# Patient Record
Sex: Male | Born: 1990 | Race: Black or African American | Hispanic: No | Marital: Single | Smoking: Never smoker
Health system: Southern US, Community
[De-identification: ages and names within clinical notes are randomized; demographics above are authoritative.]

## PROBLEM LIST (undated history)

## (undated) DIAGNOSIS — K649 Unspecified hemorrhoids: Secondary | ICD-10-CM

---
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
## 2011-01-06 LAB — TSH: TSH: 0.573 u[IU]/mL (ref 0.350–4.500)

## 2011-01-14 ENCOUNTER — Emergency Department: Payer: Self-pay | Admitting: *Deleted

## 2012-05-06 IMAGING — CR DG SHOULDER 3+V*L*
1 series · 2 of 2 positions shown · non-contrast
Comparison: none

REASON FOR EXAM: pain
COMMENTS:

PROCEDURE:     DXR - DXR SHOULDER LEFT COMPLETE  - January 14, 2011 [DATE]
RESULT:     Images of the left shoulder demonstrate an anterior location
with inferior displacement of the humeral head. No fracture is seen.

[Series 1: view not recorded · 0.17mm/px · 2 of 2 slices shown]
[im 1/2]
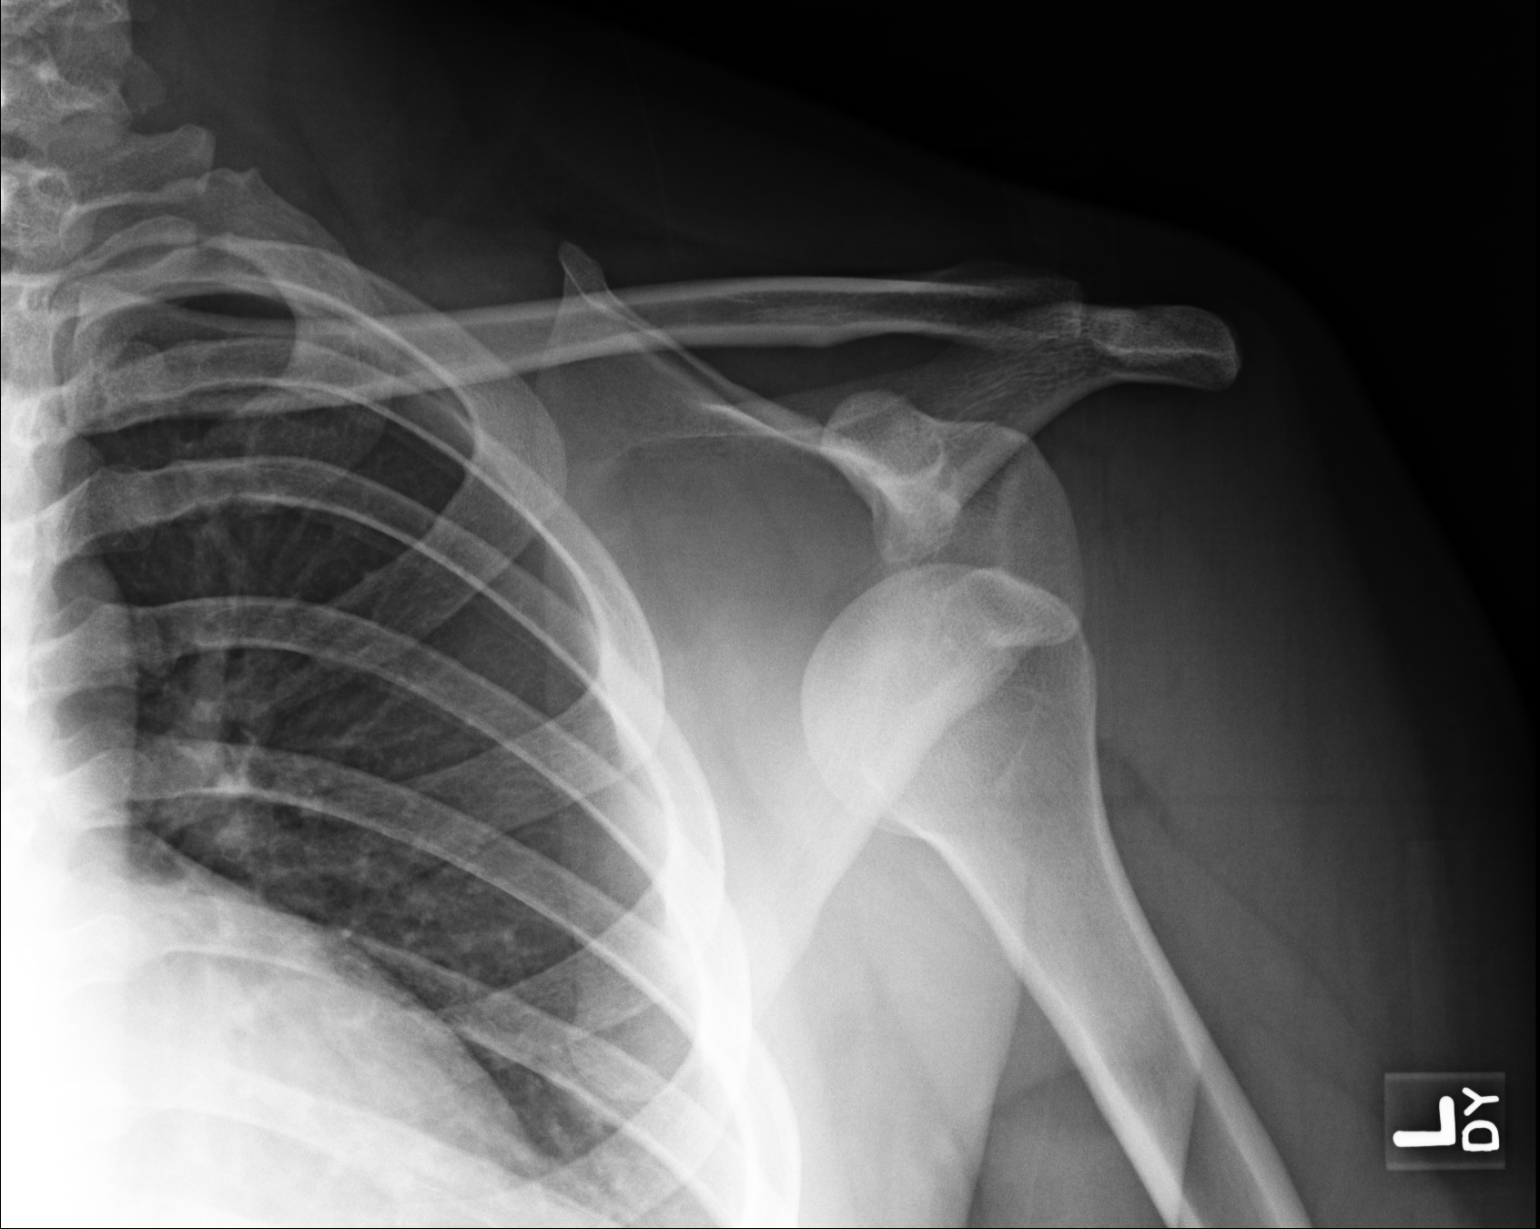
[im 2/2]
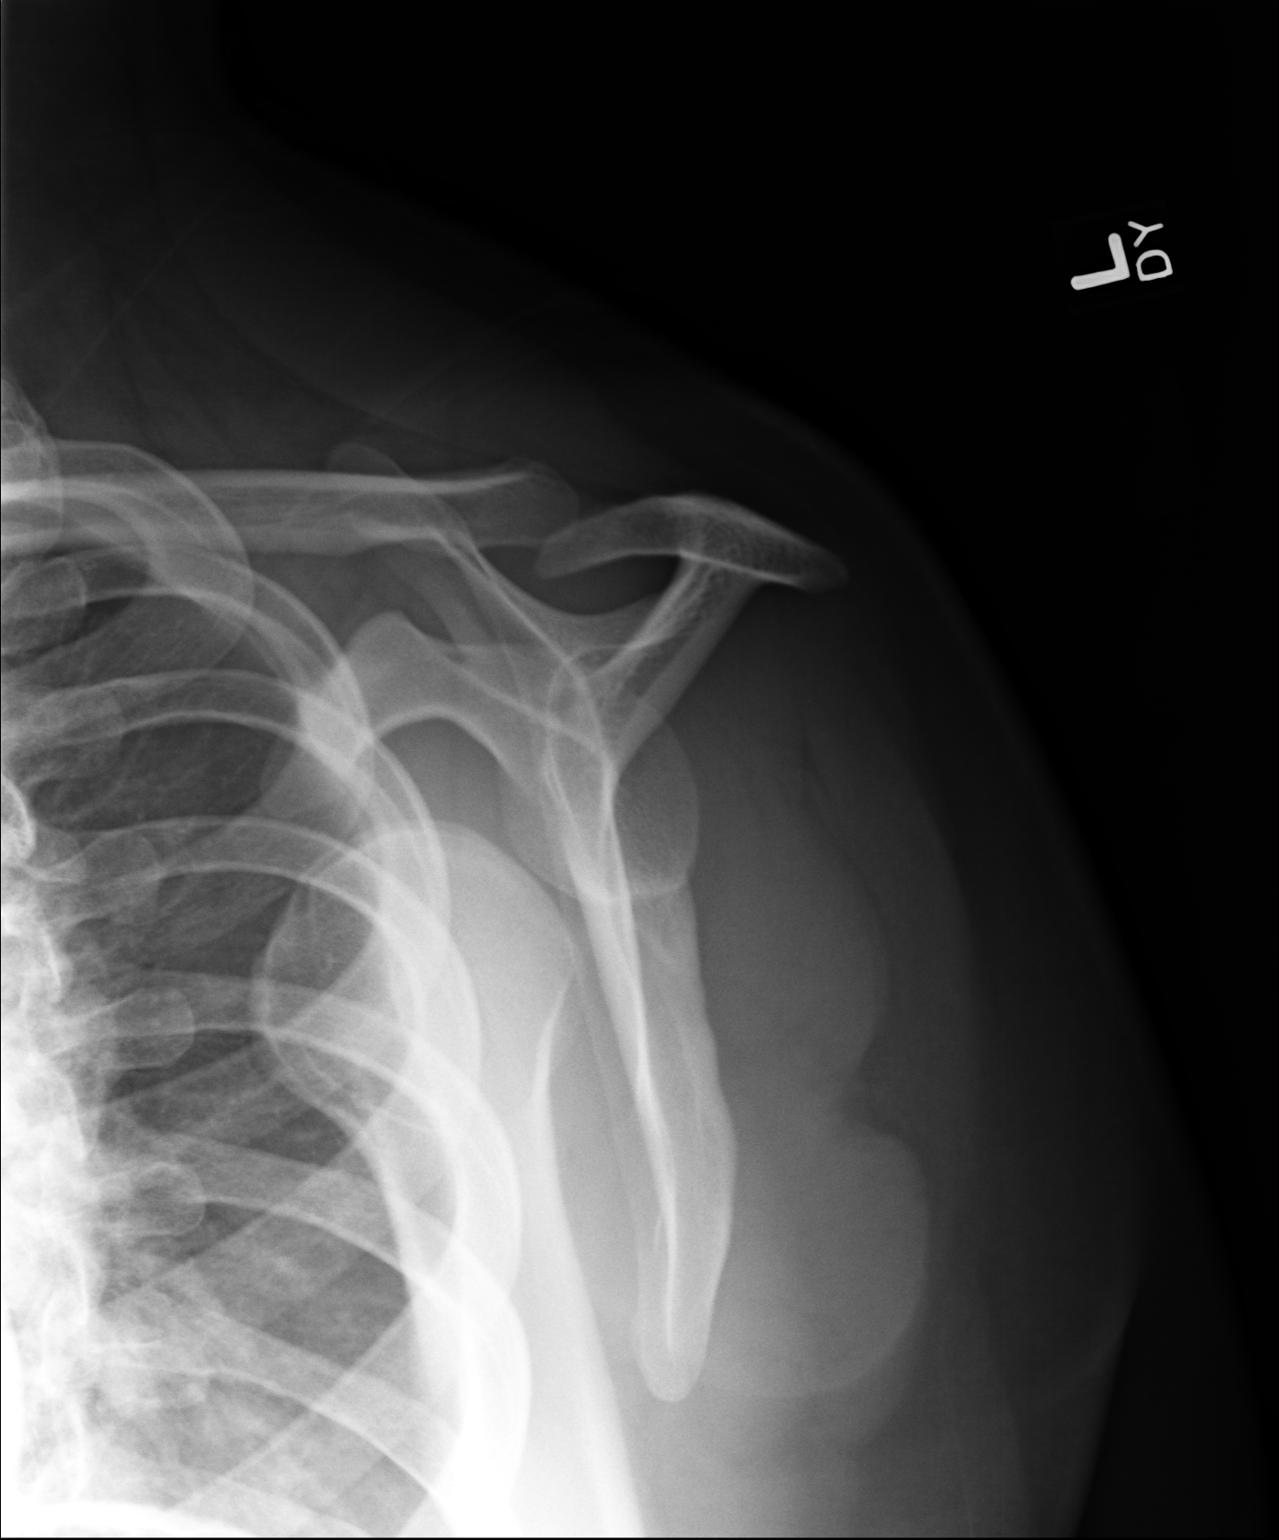

[2 of 2 positions shown; findings below may reference images not displayed]

IMPRESSION: Left humeral dislocation anteriorly.

## 2012-05-06 IMAGING — CR DG SHOULDER 1V*L*
1 series · 1 of 1 positions shown · non-contrast
Comparison: none

REASON FOR EXAM: post reduction
COMMENTS:   Bedside (portable):Y

PROCEDURE:     DXR - DXR SHOULDER LEFT ONE VIEW  - January 14, 2011  [DATE]
RESULT:     Post reduction left shoulder image demonstrates the humeral head
appears located in the glenoid. No fractures appreciated on this single view.

[view not recorded]
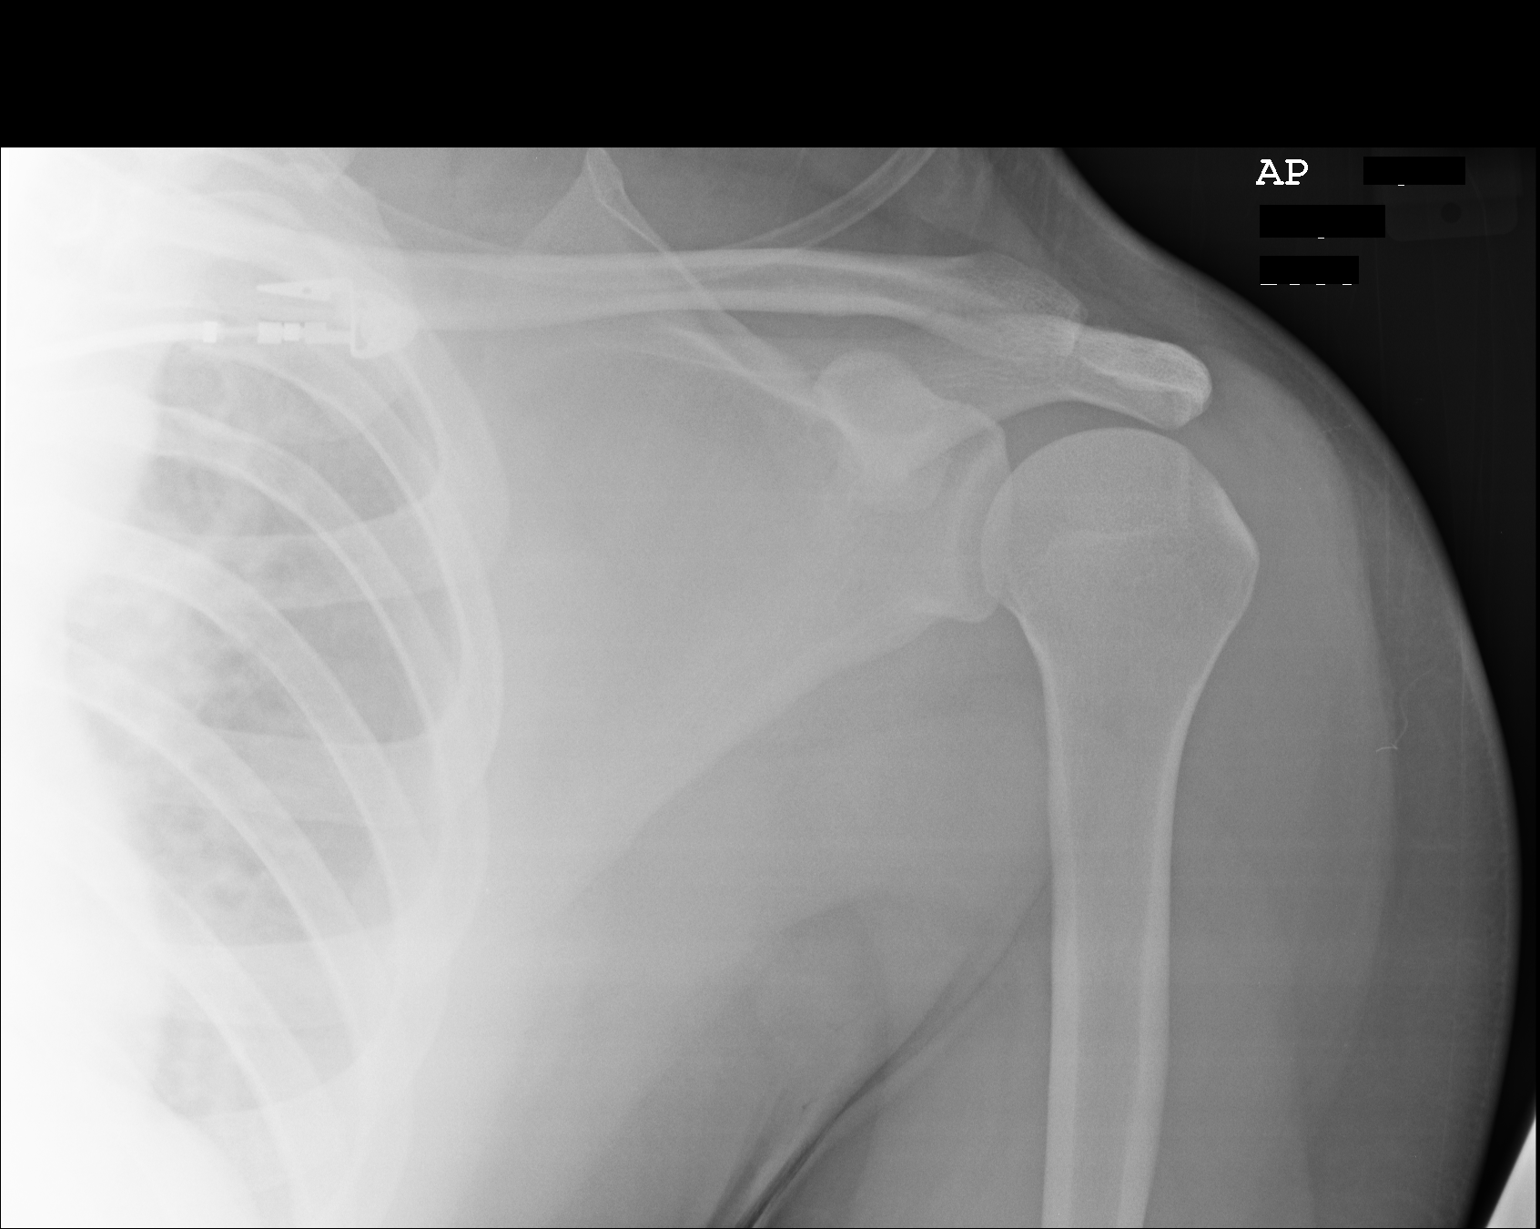

[1 of 1 positions shown; findings below may reference images not displayed]

IMPRESSION: Please see above.

## 2013-11-14 ENCOUNTER — Ambulatory Visit: Payer: Self-pay | Admitting: Family Medicine

## 2017-04-23 ENCOUNTER — Ambulatory Visit (INDEPENDENT_AMBULATORY_CARE_PROVIDER_SITE_OTHER): Payer: BLUE CROSS/BLUE SHIELD

## 2017-04-23 ENCOUNTER — Other Ambulatory Visit: Payer: Self-pay

## 2017-04-23 ENCOUNTER — Encounter: Payer: Self-pay | Admitting: Emergency Medicine

## 2017-04-23 ENCOUNTER — Ambulatory Visit
Admission: EM | Admit: 2017-04-23 | Discharge: 2017-04-23 | Disposition: A | Discharge status: Home / Self Care | Payer: BLUE CROSS/BLUE SHIELD | Attending: Emergency Medicine | Admitting: Emergency Medicine

## 2017-04-23 DIAGNOSIS — J181 Lobar pneumonia, unspecified organism: Secondary | ICD-10-CM

## 2017-04-23 DIAGNOSIS — J029 Acute pharyngitis, unspecified: Secondary | ICD-10-CM

## 2017-04-23 DIAGNOSIS — J189 Pneumonia, unspecified organism: Secondary | ICD-10-CM

## 2017-04-23 DIAGNOSIS — R05 Cough: Secondary | ICD-10-CM

## 2017-04-23 DIAGNOSIS — M791 Myalgia, unspecified site: Secondary | ICD-10-CM | POA: Diagnosis not present

## 2017-04-23 LAB — RAPID INFLUENZA A&B ANTIGENS
Influenza A (ARMC): NEGATIVE
Influenza B (ARMC): NEGATIVE

## 2017-04-23 LAB — RAPID STREP SCREEN (MED CTR MEBANE ONLY): Streptococcus, Group A Screen (Direct): NEGATIVE

## 2017-04-23 MED ORDER — ALBUTEROL SULFATE HFA 108 (90 BASE) MCG/ACT IN AERS: 2.0000 | INHALATION_SPRAY | RESPIRATORY_TRACT | 0 refills | Status: DC | PRN

## 2017-04-23 MED ORDER — AZITHROMYCIN 250 MG PO TABS: 250.0000 mg | ORAL_TABLET | Freq: Every day | ORAL | 0 refills | Status: DC

## 2017-04-23 MED ORDER — HYDROCOD POLST-CPM POLST ER 10-8 MG/5ML PO SUER: 5.0000 mL | Freq: Two times a day (BID) | ORAL | 0 refills | Status: DC | PRN

## 2017-04-23 MED ORDER — AEROCHAMBER PLUS MISC: 2 refills | Status: DC

## 2017-04-23 MED ORDER — BENZONATATE 200 MG PO CAPS: 200.0000 mg | ORAL_CAPSULE | Freq: Three times a day (TID) | ORAL | 0 refills | Status: DC | PRN

## 2017-04-23 MED ORDER — IBUPROFEN 800 MG PO TABS
800.0000 mg | ORAL_TABLET | Freq: Once | ORAL | Status: AC
  Administered 2017-04-23: 800 mg via ORAL

## 2017-04-23 MED ORDER — FLUTICASONE PROPIONATE 50 MCG/ACT NA SUSP: 2.0000 | Freq: Every day | NASAL | 0 refills | Status: DC

## 2017-04-23 MED ORDER — IBUPROFEN 600 MG PO TABS: 600.0000 mg | ORAL_TABLET | Freq: Four times a day (QID) | ORAL | 0 refills | Status: DC | PRN

## 2017-04-23 NOTE — ED Triage Notes (Signed)
Patient c/o sore throat, cough and congestion since Tuesday.  Patient reports fever for the past 2 days.

## 2017-04-23 NOTE — ED Provider Notes (Signed)
HPI  SUBJECTIVE:  [name] is a 27 y.o. male who presents with body aches, headaches, greenish nasal congestion, postnasal drip, sore throat and fevers T-max 103.4 for 5 days.  He reports a cough productive of the same material as his nasal congestion, wheezing at night.  Reports shortness of breath with exertion.  No ear pain, sinus pain or pressure, dental pain, no muffled, hot potato voice, no drooling, trismus, neck stiffness.  No chest pain, shortness of breath.  States that he cannot sleep at night secondary to the cough.  No abdominal pain, urinary complaints, diarrhea, rash.  No photophobia.  No antibiotics in the past month, antipyretic in the past 6-8 hours.  He has been doing steam baths, ibuprofen 400 mg, Alka-Seltzer flu.  Steam helps.  No aggravating factors.  He did not get a flu shot this year.  No contacts with flu or strep.  He has a past medical history of chronic nosebleeds.  No history of diabetes, hypertension, mono, asthma, emphysema, COPD, immunocompromise.  PMD: None.    History reviewed. No pertinent past medical history.  History reviewed. No pertinent surgical history.  History reviewed. No pertinent family history.  Social History   Tobacco Use  . Smoking status: Never Smoker  . Smokeless tobacco: Never Used  Substance Use Topics  . Alcohol use: Yes  . Drug use: Not on file    No current facility-administered medications for this encounter.   Current Outpatient Medications:  .  albuterol (PROVENTIL HFA;VENTOLIN HFA) 108 (90 Base) MCG/ACT inhaler, Inhale 2 puffs into the lungs every 4 (four) hours as needed for wheezing or shortness of breath. Dispense with aerochamber, Disp: 1 Inhaler, Rfl: 0 .  azithromycin (ZITHROMAX) 250 MG tablet, Take 1 tablet (250 mg total) by mouth daily. 2 tabs po on day 1, 1 tab po on days 2-5, Disp: 6 tablet, Rfl: 0 .  benzonatate (TESSALON) 200 MG capsule, Take 1 capsule (200 mg total) by mouth 3 (three) times daily as  needed for cough., Disp: 30 capsule, Rfl: 0 .  chlorpheniramine-HYDROcodone (TUSSIONEX PENNKINETIC ER) 10-8 MG/5ML SUER, Take 5 mLs by mouth every 12 (twelve) hours as needed for cough., Disp: 120 mL, Rfl: 0 .  fluticasone (FLONASE) 50 MCG/ACT nasal spray, Place 2 sprays into both nostrils daily., Disp: 16 g, Rfl: 0 .  ibuprofen (ADVIL,MOTRIN) 600 MG tablet, Take 1 tablet (600 mg total) by mouth every 6 (six) hours as needed., Disp: 30 tablet, Rfl: 0 .  Spacer/Aero-Holding Chambers (AEROCHAMBER PLUS) inhaler, Use as instructed, Disp: 1 each, Rfl: 2  No Known Allergies   ROS  As noted in HPI.   Physical Exam  BP (!) 137/99 (BP Location: Left Arm)   Pulse (!) 120   Temp (!) 101.3 F (38.5 C) (Oral)   Resp 16   Ht 5\' 10"  (1.778 m)   Wt (!) 355 lb (161 kg)   SpO2 97%   BMI 50.94 kg/m   Constitutional: Well developed, well nourished, no acute distress Eyes: PERRL, EOMI, conjunctiva normal bilaterally HENT: Normocephalic, atraumatic,mucus membranes moist mild nasal congestion.  Erythematous, swollen turbinates.  No sinus tenderness.  Erythematous oropharynx.  Tonsils surgically absent.  Positive cobblestoning, positive postnasal drip. Neck: No appreciable cervical lymphadenopathy, meningismus. Respiratory: Clear to auscultation bilaterally, no rales, no wheezing, no rhonchi Cardiovascular: Regular tachycardia, no murmurs, no gallops, no rubs GI: Soft, nondistended, normal bowel sounds, nontender, no rebound, no guarding Back: no CVAT skin: No rash, skin intact Musculoskeletal: No edema,  no tenderness, no deformities Neurologic: Alert & oriented x 3, CN II-XII grossly intact, no motor deficits, sensation grossly intact Psychiatric: Speech and behavior appropriate   ED Course   Medications  ibuprofen (ADVIL,MOTRIN) tablet 800 mg (800 mg Oral Given 04/23/17 0830)    Orders Placed This Encounter  Procedures  . Rapid Influenza A&B Antigens ([facility] only)    Standing Status:    Standing    Number of Occurrences:   1  . Rapid strep screen    Standing Status:   Standing    Number of Occurrences:   1  . Culture, group A strep    Standing Status:   Standing    Number of Occurrences:   1  . DG Chest 2 View    Standing Status:   Standing    Number of Occurrences:   1    Order Specific Question:   Reason for Exam (SYMPTOM  OR DIAGNOSIS REQUIRED)    Answer:   cough fever r/o PNA  . Droplet precaution    Standing Status:   Standing    Number of Occurrences:   1   Results for orders placed or performed during the hospital encounter of 04/23/17 (from the past 24 hour(s))  Rapid Influenza A&B Antigens ([facility] only)     Status: None   Collection Time: 04/23/17  8:21 AM  Result Value Ref Range   Influenza A ([facility]) NEGATIVE NEGATIVE   Influenza B ([facility]) NEGATIVE NEGATIVE  Rapid strep screen     Status: None   Collection Time: 04/23/17  8:21 AM  Result Value Ref Range   Streptococcus, Group A Screen (Direct) NEGATIVE NEGATIVE   Dg Chest 2 View  Result Date: 04/23/2017 CLINICAL DATA:  Cough, fever EXAM: CHEST  2 VIEW COMPARISON:  06/17/2008 FINDINGS: Patchy right lower lobe airspace process obscures the right hemidiaphragm and the medial cardiac border. This is posterior on the lateral view and compatible with right lower lobe pneumonia. Left lung remains clear. Mild cardiac enlargement but normal vascularity. No edema, effusion, or pneumothorax. Trachea is midline. IMPRESSION: Patchy right lower lobe pneumonia. Recommend radiographic follow-up after medical therapy to document resolution. Electronically Signed   By: Judie Petit.  Shick M.D.   On: 04/23/2017 09:05    ED Clinical Impression  Community acquired pneumonia of right lower lobe of lung [facility])   ED Assessment/Plan  Checking strep, flu, chest x-ray because of fever and cough.  Will reevaluate.    [place] Narcotic database reviewed for this patient, and feel that the risk/benefit ratio today is favorable for proceeding with  a prescription for controlled substance. no opiate rx in 2 years  Reviewed imaging independently.  Patchy right lower lobe pneumonia. See radiology report for full details.  Will send home with an albuterol inhaler with a spacer, azithromycin Z-Pak as he is otherwise healthy and has no comorbidities,  Flonase, ibuprofen 600 mg take with 1 g of Tylenol.  Advised saline nasal irrigation.  Also Tussionex, Tessalon.  Will write a work note until Thursday.  Will provide a primary care referral list and order primary care referral so that patient can be followed up and have a repeat chest x-ray in 3-4 weeks, or may return here.  He is to go to the ER if he gets worse.  Discussed  imaging, MDM, plan and followup with patient Discussed sn/sx that should prompt return to the ED. patient agrees with plan.   Meds ordered this encounter  Medications  . ibuprofen (ADVIL,MOTRIN)  tablet 800 mg  . azithromycin (ZITHROMAX) 250 MG tablet    Sig: Take 1 tablet (250 mg total) by mouth daily. 2 tabs po on day 1, 1 tab po on days 2-5    Dispense:  6 tablet    Refill:  0  . albuterol (PROVENTIL HFA;VENTOLIN HFA) 108 (90 Base) MCG/ACT inhaler    Sig: Inhale 2 puffs into the lungs every 4 (four) hours as needed for wheezing or shortness of breath. Dispense with aerochamber    Dispense:  1 Inhaler    Refill:  0  . ibuprofen (ADVIL,MOTRIN) 600 MG tablet    Sig: Take 1 tablet (600 mg total) by mouth every 6 (six) hours as needed.    Dispense:  30 tablet    Refill:  0  . benzonatate (TESSALON) 200 MG capsule    Sig: Take 1 capsule (200 mg total) by mouth 3 (three) times daily as needed for cough.    Dispense:  30 capsule    Refill:  0  . chlorpheniramine-HYDROcodone (TUSSIONEX PENNKINETIC ER) 10-8 MG/5ML SUER    Sig: Take 5 mLs by mouth every 12 (twelve) hours as needed for cough.    Dispense:  120 mL    Refill:  0  . Spacer/Aero-Holding Chambers (AEROCHAMBER PLUS) inhaler    Sig: Use as instructed    Dispense:   1 each    Refill:  2  . fluticasone (FLONASE) 50 MCG/ACT nasal spray    Sig: Place 2 sprays into both nostrils daily.    Dispense:  16 g    Refill:  0    *This clinic note was created using Scientist, clinical (histocompatibility and immunogenetics)Dragon dictation software. Therefore, there may be occasional mistakes despite careful proofreading.  ?   Domenick GongMortenson, Cylas Falzone, MD 04/23/17 1110

## 2017-04-23 NOTE — Discharge Instructions (Signed)
albuterol inhaler with a spacer, azithromycin Z-Pak, Flonase, ibuprofen 600 mg take with 1 g of Tylenol.  saline nasal irrigation with a Lloyd HugerNeil med sinus rinse.  Also Tussionex for cough at night, Tessalon for cough during the day.  Follow-up with 1 of the primary care providers listed below for repeat chest x-ray in 3-4 weeks make sure that your pneumonia got completely better.  Or you may return here.  go to the ER if he gets worse.  Here is a list of primary care providers who are taking new patients:  Dr. Elizabeth Sauereanna Jones, Dr. Schuyler AmorWilliam Plonk [address] [place] [address] [phone]  [facility] [place] [address]  [place] [address]  [phone]  [facility] [address]  [place], [place] [address] [phone]  [facility] [place] [address]  [phone] [place], [place] [address]  Here are clinics/ other resources who will see you if you do not have insurance. Some have certain criteria that you must meet. Call them and find out what they are:  [facility]: [address] [place] [address] [place], [place] [address] Phone: [phone] Hours: First and Third Saturdays of each Month, 9 a.m. - 1 p.m.  [facility]: [address], [place], [place] [address] Phone: [phone] Hours: Tuesday, 4 p.m. - 8 p.m. Thursday, 1 p.m. - 8 p.m. Wednesday, 9 a.m. - [facility]  [place] [facility] [address], [place], [place] [address] Phone: [phone] Pharmacy Phone Number: [phone] Dental Phone Number: [phone] [facility] Insurance Help: [phone]  Dental Hours: Monday - Thursday, 8 a.m. - 6 p.m.  [facility] [address] [place], [place] [address] Phone: [phone] Pharmacy Phone Number: [phone] [facility] Insurance Help: [phone]  [facility] [address] [place], [place] [address] Phone: [phone] Pharmacy Phone Number: [phone] [facility] Insurance  Help: [phone]  [facility] [address] [place], [place] [address] Phone: [phone] [facility] Insurance Help: [phone]   [facility] [address] [place], [place] [address] Phone: [phone]  Go to www.goodrx.com to look up your medications. This will give you a list of where you can find your prescriptions at the most affordable prices. Or ask the pharmacist what the cash price is, or if they have any other discount programs available to help make your medication more affordable. This can be less expensive than what you would pay with insurance.

## 2017-04-25 LAB — CULTURE, GROUP A STREP (THRC)

## 2017-04-26 ENCOUNTER — Telehealth: Payer: Self-pay

## 2017-04-26 NOTE — Telephone Encounter (Signed)
Called to follow up with patient [facility]. Spoke with pt., still reports symptoms. Encouraged to come back in or follow up with primary MD for re-evaluation. Patient instructed to call back with any questions or concerns. [facility]

## 2018-10-31 ENCOUNTER — Other Ambulatory Visit: Payer: Self-pay

## 2018-10-31 ENCOUNTER — Ambulatory Visit
Admission: EM | Admit: 2018-10-31 | Discharge: 2018-10-31 | Disposition: A | Discharge status: Home / Self Care | Payer: Managed Care, Other (non HMO) | Attending: Urgent Care | Admitting: Urgent Care

## 2018-10-31 ENCOUNTER — Encounter: Payer: Self-pay | Admitting: Emergency Medicine

## 2018-10-31 DIAGNOSIS — H60502 Unspecified acute noninfective otitis externa, left ear: Secondary | ICD-10-CM | POA: Diagnosis not present

## 2018-10-31 MED ORDER — CIPRODEX 0.3-0.1 % OT SUSP: 4.0000 [drp] | Freq: Two times a day (BID) | OTIC | 0 refills | Status: DC

## 2018-10-31 NOTE — ED Provider Notes (Signed)
[name], [name]   Name: [name] DOB: 01-Dec-1990 MRN: [id number] CSN: [id number] PCP: Patient, No Pcp Per  Arrival date and time:  10/31/18 1029  Chief Complaint:  Otalgia (left)   NOTE: Prior to seeing the patient today, I have reviewed the triage nursing documentation and vital signs. Clinical staff has updated patient's PMH/PSHx, current medication list, and drug allergies/intolerances to ensure comprehensive history available to assist in medical decision making.   History:   HPI: [name] is a 28 y.o. male who presents today with complaints of pain in his LEFT ear. Pain began with acute onset on Thursday (10/24/2018) and has progressively worsened since. He denies any associated fevers. Patient has not had any other recent upper respiratory symptoms; no cough, congestion, rhinorrhea, sneezing, or sore throat. He denies forceful nose blowing. Patient has not appreciated any otorrhea. He advises that his ability to hear from the LEFT ear has acutely changed with the onset of the pain; describes hearing as being somewhat muffled. Patient denies history of recurrent ear infections. He has never had tympanostomy tubes in the past. Patient advising that he has not been swimming in the recent past. Patient wears headphones at work, which significantly increases his pain. He states, "the vibration from when people talk through the headphones causes the pain to get so much worse". Patient denies the use of cotton tip swabs to clean his ears. Patient does not have a history of seasonal allergies.  History reviewed. No pertinent past medical history.  Past Surgical History:  Procedure Laterality Date   NO PAST SURGERIES      Family History  Problem Relation Age of Onset   Multiple myeloma Mother    Heart failure Mother     Social History   Tobacco Use   Smoking status: Never Smoker   Smokeless tobacco: Never Used  Substance Use Topics   Alcohol use: Yes    Drug use: Not on file    There are no active problems to display for this patient.   Home Medications:    No outpatient medications have been marked as taking for the 10/31/18 encounter [facility] Encounter).    Allergies:   Patient has no known allergies.  Review of Systems (ROS): Review of Systems  Constitutional: Negative for chills and fever.  HENT: Positive for ear pain. Negative for congestion, ear discharge, postnasal drip, rhinorrhea, sinus pressure, sinus pain, sneezing and tinnitus.   Respiratory: Negative for cough and shortness of breath.   Cardiovascular: Negative for chest pain and palpitations.  Skin: Negative for color change, pallor and rash.  Neurological: Negative for dizziness, syncope, weakness and headaches.  Hematological: Negative for adenopathy.  All other systems reviewed and are negative.    Vital Signs: Today's Vitals   10/31/18 1044 10/31/18 1047  BP:  102/85  Pulse:  (!) 103  Resp:  16  Temp:  98.8 F (37.1 C)  TempSrc:  Oral  SpO2:  98%  Weight: (!) 357 lb (161.9 kg)   Height: 5' 10"  (1.778 m)   PainSc: 7      Physical Exam: Physical Exam  Constitutional: He is oriented to person, place, and time and well-developed, well-nourished, and in no distress.  HENT:  Head: Normocephalic and atraumatic.  Right Ear: Hearing, tympanic membrane, external ear and ear canal normal.  Left Ear: There is tenderness. No drainage or swelling. Tympanic membrane is not injected, not erythematous and not bulging. A middle ear effusion (mild serous) is present. Decreased  hearing (muffled) is noted.  Mouth/Throat: Oropharynx is clear and moist and mucous membranes are normal.  EAC on LEFT markedly erythematous and edematous; no drainage.   Eyes: Pupils are equal, round, and reactive to light. EOM are normal.  Neck: Neck supple. No tracheal deviation present.  Cardiovascular: Normal rate, regular rhythm, normal heart sounds and intact distal pulses. Exam  reveals no gallop and no friction rub.  No murmur heard. Pulmonary/Chest: Effort normal and breath sounds normal. No respiratory distress. He has no wheezes. He has no rales.  Lymphadenopathy:    He has no cervical adenopathy.  Neurological: He is alert and oriented to person, place, and time. Gait normal. GCS score is 15.  Skin: Skin is warm and dry. No rash noted.  Psychiatric: Mood, memory, affect and judgment normal.  Nursing note and vitals reviewed.   Urgent Care Treatments / Results:   LABS: PLEASE NOTE: all labs that were ordered this encounter are listed, however only abnormal results are displayed. Labs Reviewed - No data to display  EKG: -None  RADIOLOGY: No results found.  PROCEDURES: Procedures  MEDICATIONS RECEIVED THIS VISIT: Medications - No data to display  PERTINENT CLINICAL COURSE NOTES/UPDATES:   Initial Impression / Assessment and Plan / Urgent Care Course:  Pertinent labs & imaging results that were available during my care of the patient were personally reviewed by me and considered in my medical decision making (see lab/imaging section of note for values and interpretations).  [name] is a 28 y.o. male who [facility] today with complaints of Otalgia (left)   Patient is well appearing overall in clinic today. He does not appear to be in any acute distress. Presenting symptoms (see HPI) and exam as documented above. Symptoms and exam consistent with LEFT otitis externa with mild MEE. Will treat with Ciprodex gtts. Patient to use APAP and/or IBU as needed for pain. Encouraged to keep ears clean and dry; avoiding cotton tipped swabs. Discussed drying allergy medication, such as cetirizine, to help with effusion. He was encouraged to avoid used headphones until infection clears.  Current clinical condition warrants patient being out of work in order to recover from his current injury/illness. He was provided with the  appropriate documentation to provide to his place of employment that will allow for him to RTW on 11/01/2018 with no restrictions.   Discussed follow up with primary care physician in 1 week for re-evaluation. I have reviewed the follow up and strict return precautions for any new or worsening symptoms. Patient is aware of symptoms that would be deemed urgent/emergent, and would thus require further evaluation either here or in the emergency department. At the time of discharge, he verbalized understanding and consent with the discharge plan as it was reviewed with him. All questions were fielded by provider and/or clinic staff prior to patient discharge.    Final Clinical Impressions / Urgent Care Diagnoses:   Final diagnoses:  Acute otitis externa of left ear, unspecified type    New Prescriptions:  [place] Controlled Substance Registry consulted? Not Applicable  Meds ordered this encounter  Medications   ciprofloxacin-dexamethasone (CIPRODEX) OTIC suspension    Sig: Place 4 drops into the left ear 2 (two) times daily. X 1 week    Dispense:  7.5 mL    Refill:  0    Recommended Follow up Care:  Patient encouraged to follow up with the following provider within the specified time frame, or sooner as dictated by the  severity of his symptoms. As always, he was instructed that for any urgent/emergent care needs, he should seek care either here or in the emergency department for more immediate evaluation.  Follow-up Information    PCP In 1 week.   Why: General reassessment of symptoms if not improving        NOTE: This note was prepared using Lobbyist along with smaller Company secretary. Despite my best ability to proofread, there is the potential that transcriptional errors may still occur from this process, and are completely unintentional.     Karen Kitchens, NP 11/02/18 [phone]

## 2018-10-31 NOTE — ED Triage Notes (Signed)
Pt c/o left ear pain. Started about a week ago. He also has decreased hearing. Denies fever.

## 2018-10-31 NOTE — Discharge Instructions (Signed)
It was very nice seeing you today in clinic. Thank you for entrusting me with your care.   Please utilize the medications that we discussed. Your prescriptions have been called in to your pharmacy. Keep ear clean and dry.   Make arrangements to follow up with your regular doctor in 1 week for re-evaluation if not improving. If your symptoms/condition worsens, please seek follow up care either here or in the ER. Please remember, our [place] providers are "right here with you" when you need [place].   Again, it was my pleasure to take care of you today. Thank you for choosing our clinic. I hope that you start to feel better quickly.   Honor Loh, MSN, APRN, FNP-C, CEN Advanced Practice Provider [facility]

## 2020-06-09 ENCOUNTER — Encounter: Payer: Self-pay | Admitting: Emergency Medicine

## 2020-06-09 ENCOUNTER — Ambulatory Visit
Admission: EM | Admit: 2020-06-09 | Discharge: 2020-06-09 | Disposition: A | Discharge status: Home / Self Care | Payer: Managed Care, Other (non HMO) | Attending: Sports Medicine | Admitting: Sports Medicine

## 2020-06-09 ENCOUNTER — Other Ambulatory Visit: Payer: Self-pay

## 2020-06-09 DIAGNOSIS — K644 Residual hemorrhoidal skin tags: Secondary | ICD-10-CM

## 2020-06-09 DIAGNOSIS — K625 Hemorrhage of anus and rectum: Secondary | ICD-10-CM | POA: Diagnosis not present

## 2020-06-09 HISTORY — DX: Unspecified hemorrhoids: K64.9

## 2020-06-09 MED ORDER — HYDROCORTISONE (PERIANAL) 2.5 % EX CREA: 1.0000 "application " | TOPICAL_CREAM | Freq: Two times a day (BID) | CUTANEOUS | 0 refills | Status: AC

## 2020-06-09 MED ORDER — WITCH HAZEL-GLYCERIN EX PADS: 1.0000 "application " | MEDICATED_PAD | CUTANEOUS | 12 refills | Status: AC | PRN

## 2020-06-09 NOTE — ED Triage Notes (Signed)
Pt c/o rectal bleeding. He states he has h/o hemorrhoids and feels similar  to a flare he has had in the past. He states the blood was bright red and in the toilet paper and in the toilet bowl.

## 2020-06-09 NOTE — ED Provider Notes (Signed)
[facility]    CSN: [id number] Arrival date & time: 06/09/20  1309      History   Chief Complaint Chief Complaint  Patient presents with  . Rectal Bleeding    HPI [name] is a 30 y.o. male.   Pleasant 30 year old male who presents for evaluation of an acute flare of a chronic problem.  Please see the triage note for full details.  He reports 3 days of intermittent bright red blood per rectum.  He says he gets it intermittently for years.  It only occurs intermittently after a bowel movement over the past 3 days.  Says he has a flare about once a year.  He denies any straining.  He said he had some loose stools this week and occurred after that.  No fever shakes chills.  No nausea vomiting or diarrhea.  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  Has not been to work today and wants a work note.  He does not have a primary care physician.  He denies any significant constipation.  No abdominal pain or doubt abdominal surgeries.  No urinary symptoms.  No red flag signs or symptoms elicited on history.     Past Medical History:  Diagnosis Date  . Hemorrhoid     There are no problems to display for this patient.   Past Surgical History:  Procedure Laterality Date  . NO PAST SURGERIES         Home Medications    Prior to Admission medications   Medication Sig Start Date End Date Taking? Authorizing Provider  hydrocortisone (ANUSOL-HC) 2.5 % rectal cream Place 1 application rectally 2 (two) times daily. 06/09/20  Yes Verda Cumins, MD  witch hazel-glycerin (TUCKS) pad Apply 1 application topically as needed for itching. 06/09/20  Yes Verda Cumins, MD  ciprofloxacin-dexamethasone [facility]) OTIC suspension Place 4 drops into the left ear 2 (two) times daily. X 1 week 10/31/18   Karen Kitchens, NP  albuterol (PROVENTIL HFA;VENTOLIN HFA) 108 (90 Base) MCG/ACT inhaler Inhale 2 puffs into the lungs every 4 (four)  hours as needed for wheezing or shortness of breath. Dispense with aerochamber 04/23/17 10/31/18  Melynda Ripple, MD  fluticasone (FLONASE) 50 MCG/ACT nasal spray Place 2 sprays into both nostrils daily. 04/23/17 10/31/18  Melynda Ripple, MD    Family History Family History  Problem Relation Age of Onset  . Multiple myeloma Mother   . Heart failure Mother     Social History Social History   Tobacco Use  . Smoking status: Never Smoker  . Smokeless tobacco: Never Used  Vaping Use  . Vaping Use: Never used  Substance Use Topics  . Alcohol use: Yes     Allergies   Patient has no known allergies.   Review of Systems Review of Systems  Constitutional: Negative for activity change, appetite change, chills, diaphoresis, fatigue and fever.  HENT: Negative.   Eyes: Negative.   Respiratory: Negative.   Cardiovascular: Negative.   Gastrointestinal: Positive for anal bleeding and blood in stool. Negative for abdominal distention, abdominal pain, constipation, diarrhea, nausea, rectal pain and vomiting.  Genitourinary: Negative.   Musculoskeletal: Negative.   Skin: Negative.   Neurological: Negative.   All other systems reviewed and are negative.    Physical Exam Triage Vital Signs ED Triage Vitals  Enc Vitals Group     BP 06/09/20 1358 (!) 153/98     Pulse Rate 06/09/20  1358 79     Resp 06/09/20 1358 18     Temp 06/09/20 1358 98.2 F (36.8 C)     Temp Source 06/09/20 1358 Oral     SpO2 06/09/20 1358 99 %     Weight 06/09/20 1356 (!) 356 lb 14.8 oz (161.9 kg)     Height 06/09/20 1356 _0  (1.778 m)     Head Circumference --      Peak Flow --      Pain Score 06/09/20 1356 0     Pain Loc --      Pain Edu? --      Excl. in [place]? --    No data found.  Updated Vital Signs BP (!) 153/98   Pulse 79   Temp 98.2 F (36.8 C) (Oral)   Resp 18   Ht _1  (1.778 m)   Wt (!) 161.9 kg   SpO2 99%   BMI 51.21 kg/m   Visual Acuity Right Eye Distance:   Left Eye  Distance:   Bilateral Distance:    Right Eye Near:   Left Eye Near:    Bilateral Near:     Physical Exam Vitals and nursing note reviewed.  Constitutional:      General: He is not in acute distress.    Appearance: Normal appearance. He is not ill-appearing, toxic-appearing or diaphoretic.  HENT:     Head: Normocephalic and atraumatic.  Cardiovascular:     Rate and Rhythm: Normal rate and regular rhythm.     Pulses: Normal pulses.     Heart sounds: Normal heart sounds.  Pulmonary:     Effort: Pulmonary effort is normal.     Breath sounds: Normal breath sounds.  Abdominal:     General: There is no distension.     Palpations: There is no mass.     Tenderness: There is no abdominal tenderness. There is no right CVA tenderness, left CVA tenderness, guarding or rebound.     Hernia: No hernia is present.  Genitourinary:    Comments: Patient has no blood in the rectum or anal vault.  There 2 tiny external hemorrhoids that are not tender to palpation.  No active bleeding. Skin:    General: Skin is warm and dry.     Capillary Refill: Capillary refill takes less than 2 seconds.     Findings: No lesion or rash.  Neurological:     General: No focal deficit present.     Mental Status: He is alert and oriented to person, place, and time.      UC Treatments / Results  Labs (all labs ordered are listed, but only abnormal results are displayed) Labs Reviewed - No data to display  EKG   Radiology No results found.  Procedures Procedures (including critical care time)  Medications Ordered in UC Medications - No data to display  Initial Impression / Assessment and Plan / UC Course  I have reviewed the triage vital signs and the nursing notes.  Pertinent labs & imaging results that were available during my care of the patient were reviewed by me and considered in my medical decision making (see chart for details).  Clinical impression: 3 days of intermittent bright red blood  per rectum.  Scant blood in the toilet bowl and on the toilet paper.  This is after a bowel movement.  He does have 2 small external hemorrhoids.  He does have a history of hemorrhoids.  Treatment plan: 1.  The findings and  treatment plan were discussed in detail with the patient.  Patient was in agreement. 2.  I prescribed Anusol as well as hemorrhoid wipes.  He can use that as directed. 3.  Educational handouts were provided. 4.  Should eat a diet high in fiber, fruits and vegetables. 5.  Gave him a number for general surgery if his symptoms persisted and they can evaluate him and at least he is in the system with them and if he needs any intervention he can see them in the future. 6.  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 7.  Just supportive care for now we will see him back as needed.    Final Clinical Impressions(s) / UC Diagnoses   Final diagnoses:  Rectal bleeding  External hemorrhoids without complication     Discharge Instructions     You have been diagnosed with bleeding external hemorrhoids. Gave you 2 prescriptions, one for wipe and the other for cream.  Use them as directed. 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 Also gave you the name of general surgery if this persists and you can call on your own. Over-the-counter meds as needed, supportive care.  Follow-up here as needed, I hope you get the feeling better. Dr. Drema Dallas    ED Prescriptions    Medication Sig Dispense Auth. Provider   hydrocortisone (ANUSOL-HC) 2.5 % rectal cream Place 1 application rectally 2 (two) times daily. 30 g Verda Cumins, MD   witch hazel-glycerin (TUCKS) pad Apply 1 application topically as needed for itching. 40 each Verda Cumins, MD     PDMP not reviewed this encounter.   Verda Cumins, MD 06/09/20 [phone]

## 2020-06-09 NOTE — Discharge Instructions (Signed)
You have been diagnosed with bleeding external hemorrhoids. Gave you 2 prescriptions, one for wipe and the other for cream.  Use them as directed. 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 Also gave you the name of general surgery if this persists and you can call on your own. Over-the-counter meds as needed, supportive care.  Follow-up here as needed, I hope you get the feeling better. Dr. Zachery Dauer

## 2023-11-06 ENCOUNTER — Encounter: Payer: Self-pay | Admitting: Emergency Medicine

## 2023-11-06 ENCOUNTER — Ambulatory Visit
Admission: EM | Admit: 2023-11-06 | Discharge: 2023-11-06 | Disposition: A | Discharge status: Home / Self Care | Payer: Self-pay | Attending: Emergency Medicine | Admitting: Emergency Medicine

## 2023-11-06 DIAGNOSIS — Z23 Encounter for immunization: Secondary | ICD-10-CM

## 2023-11-06 DIAGNOSIS — S61210A Laceration without foreign body of right index finger without damage to nail, initial encounter: Secondary | ICD-10-CM

## 2023-11-06 MED ORDER — CEPHALEXIN 500 MG PO CAPS: 500.0000 mg | ORAL_CAPSULE | Freq: Three times a day (TID) | ORAL | 0 refills | Status: AC

## 2023-11-06 MED ORDER — TETANUS-DIPHTH-ACELL PERTUSSIS 5-2.5-18.5 LF-MCG/0.5 IM SUSY
0.5000 mL | PREFILLED_SYRINGE | Freq: Once | INTRAMUSCULAR | Status: AC
  Administered 2023-11-06: 0.5 mL via INTRAMUSCULAR

## 2023-11-06 NOTE — ED Triage Notes (Signed)
 Pt presents a laceration to his right pointer finger today with glass. Last tetanus unknown.

## 2023-11-06 NOTE — ED Provider Notes (Addendum)
 [facility]    CSN: [id number] Arrival date & time: 11/06/23  1043      History   Chief Complaint Chief Complaint  Patient presents with   Laceration    HPI [name] is a 33 y.o. male.   HPI  33 year old male with past medical history difficult for hemorrhoids presents for evaluation of a laceration to the pad of his right index finger that occurred 45 minutes prior to arrival.  He denies any numbness or tingling.  There is no active bleeding.  He is unsure when his last tetanus shot was.  Past Medical History:  Diagnosis Date   Hemorrhoid     There are no active problems to display for this patient.   Past Surgical History:  Procedure Laterality Date   NO PAST SURGERIES         Home Medications    Prior to Admission medications   Medication Sig Start Date End Date Taking? Authorizing Provider  cephALEXin  (KEFLEX ) 500 MG capsule Take 1 capsule (500 mg total) by mouth 3 (three) times daily for 7 days. 11/06/23 11/13/23 Yes Bernardino Ditch, NP  hydrocortisone  (ANUSOL -HC) 2.5 % rectal cream Place 1 application rectally 2 (two) times daily. 06/09/20   Gwenn Kent, MD  witch hazel-glycerin  (TUCKS) pad Apply 1 application topically as needed for itching. 06/09/20   Gwenn Kent, MD  albuterol  (PROVENTIL  HFA;VENTOLIN  HFA) 108 (90 Base) MCG/ACT inhaler Inhale 2 puffs into the lungs every 4 (four) hours as needed for wheezing or shortness of breath. Dispense with aerochamber 04/23/17 10/31/18  Van Knee, MD  fluticasone  (FLONASE ) 50 MCG/ACT nasal spray Place 2 sprays into both nostrils daily. 04/23/17 10/31/18  Van Knee, MD    Family History Family History  Problem Relation Age of Onset   Multiple myeloma Mother    Heart failure Mother     Social History Social History   Tobacco Use   Smoking status: Never   Smokeless tobacco: Never  Vaping Use   Vaping status: Never Used  Substance Use Topics   Alcohol use: Yes      Allergies   Patient has no known allergies.   Review of Systems Review of Systems  Skin:  Positive for wound.  Neurological:  Negative for numbness.     Physical Exam Triage Vital Signs ED Triage Vitals  Encounter Vitals Group     BP      Girls Systolic BP Percentile      Girls Diastolic BP Percentile      Boys Systolic BP Percentile      Boys Diastolic BP Percentile      Pulse      Resp      Temp      Temp src      SpO2      Weight      Height      Head Circumference      Peak Flow      Pain Score      Pain Loc      Pain Education      Exclude from Growth Chart    No data found.  Updated Vital Signs BP (!) 148/104 (BP Location: Right Wrist)   Pulse 92   Temp 98.9 F (37.2 C) (Oral)   Resp 18   SpO2 95%   Visual Acuity Right Eye Distance:   Left Eye Distance:   Bilateral Distance:    Right Eye Near:   Left Eye Near:  Bilateral Near:     Physical Exam Vitals and nursing note reviewed.  Constitutional:      Appearance: Normal appearance. He is not ill-appearing.  HENT:     Head: Normocephalic and atraumatic.  Musculoskeletal:        General: Signs of injury present. No swelling, tenderness or deformity.  Skin:    General: Skin is warm and dry.     Capillary Refill: Capillary refill takes less than 2 seconds.     Findings: No erythema.  Neurological:     General: No focal deficit present.     Mental Status: He is alert and oriented to person, place, and time.      UC Treatments / Results  Labs (all labs ordered are listed, but only abnormal results are displayed) Labs Reviewed - No data to display  EKG   Radiology No results found.  Procedures Procedures (including critical care time)  Medications Ordered in UC Medications  Tdap (BOOSTRIX) injection 0.5 mL (has no administration in time range)    Initial Impression / Assessment and Plan / UC Course  I have reviewed the triage vital signs and the nursing  notes.  Pertinent labs & imaging results that were available during my care of the patient were reviewed by me and considered in my medical decision making (see chart for details).   Patient is a pleasant, nontoxic-appearing 33 year old male presenting for evaluation of a laceration to the pad of his right index finger as outlined in HPI above.  As seen in the image above, there is a small laceration to the lateral aspect of the pad of the index finger with no active bleeding.  The patient believes that it was from a broken glass bowl.  No evidence of foreign body.  He has full range of motion.  Sensation is intact.  I cleansed the area with saline and gauze.  The wound flap did not come open.  I applied Dermabond to help secure it in place I am allowed to heal on its own.  I will have staff update the patient's tetanus shot because there is no record of a tetanus in epic and he is unsure when his last tetanus shot was.  I will discharge him home on Keflex  500 mg 3 times daily x 7 days to prevent infection.  Return precautions reviewed.  He denies need for work note.   Final Clinical Impressions(s) / UC Diagnoses   Final diagnoses:  Laceration of right index finger without foreign body without damage to nail, initial encounter     Discharge Instructions      Keep the laceration on your right index finger clean and dry.  Allow the Dermabond to fall off on its own.  I would avoid applying any significant pressure to your right index finger to prevent opening the wound.  Take the Keflex  3 times a day with food for the next 7 days to prevent infection.  You may use over-the-counter Tylenol and or ibuprofen  according to the package instructions as needed for any pain.  If you develop any increased redness, swelling, pus drainage from the wound, red streaks up your finger, or fever please return for reevaluation.     ED Prescriptions     Medication Sig Dispense Auth. Provider    cephALEXin  (KEFLEX ) 500 MG capsule Take 1 capsule (500 mg total) by mouth 3 (three) times daily for 7 days. 21 capsule Bernardino Ditch, NP      PDMP not reviewed this  encounter.   Bernardino Ditch, NP 11/06/23 1114    Bernardino Ditch, NP 11/06/23 1115

## 2023-11-06 NOTE — Discharge Instructions (Addendum)
 Keep the laceration on your right index finger clean and dry.  Allow the Dermabond to fall off on its own.  I would avoid applying any significant pressure to your right index finger to prevent opening the wound.  Take the Keflex  3 times a day with food for the next 7 days to prevent infection.  You may use over-the-counter Tylenol and or ibuprofen  according to the package instructions as needed for any pain.  If you develop any increased redness, swelling, pus drainage from the wound, red streaks up your finger, or fever please return for reevaluation.
